# Patient Record
Sex: Female | Born: 1990 | Race: White | Hispanic: No | Marital: Single | Smoking: Current every day smoker
Health system: Southern US, Community
[De-identification: ages and names within clinical notes are randomized; demographics above are authoritative.]

---

## 2018-06-17 ENCOUNTER — Encounter (HOSPITAL_COMMUNITY): Payer: Self-pay | Admitting: *Deleted

## 2018-06-17 ENCOUNTER — Emergency Department (HOSPITAL_COMMUNITY): Payer: Self-pay

## 2018-06-17 ENCOUNTER — Emergency Department (HOSPITAL_COMMUNITY)
Admission: EM | Admit: 2018-06-17 | Discharge: 2018-06-17 | Disposition: A | Discharge status: Home / Self Care | Payer: Self-pay | Attending: Emergency Medicine | Admitting: Emergency Medicine

## 2018-06-17 DIAGNOSIS — F17210 Nicotine dependence, cigarettes, uncomplicated: Secondary | ICD-10-CM | POA: Insufficient documentation

## 2018-06-17 DIAGNOSIS — R1031 Right lower quadrant pain: Secondary | ICD-10-CM | POA: Insufficient documentation

## 2018-06-17 LAB — COMPREHENSIVE METABOLIC PANEL
ALBUMIN: 3.9 g/dL (ref 3.5–5.0)
ALT: 41 U/L (ref 0–44)
AST: 19 U/L (ref 15–41)
Alkaline Phosphatase: 52 U/L (ref 38–126)
Anion gap: 10 (ref 5–15)
BUN: 9 mg/dL (ref 6–20)
CHLORIDE: 103 mmol/L (ref 98–111)
CO2: 26 mmol/L (ref 22–32)
CREATININE: 0.73 mg/dL (ref 0.44–1.00)
Calcium: 9.3 mg/dL (ref 8.9–10.3)
GFR calc Af Amer: >60 mL/min (ref >60)
GLUCOSE: 87 mg/dL (ref 70–99)
Potassium: 3.9 mmol/L (ref 3.5–5.1)
Sodium: 139 mmol/L (ref 135–145)
TOTAL PROTEIN: 7.6 g/dL (ref 6.5–8.1)
Total Bilirubin: 0.2 mg/dL — ABNORMAL LOW (ref 0.3–1.2)

## 2018-06-17 LAB — CBC
HEMATOCRIT: 42.8 % (ref 36.0–46.0)
Hemoglobin: 14.7 g/dL (ref 12.0–15.0)
MCH: 29.8 pg (ref 26.0–34.0)
MCHC: 34.3 g/dL (ref 30.0–36.0)
MCV: 86.8 fL (ref 78.0–100.0)
PLATELETS: 237 10*3/uL (ref 150–400)
RBC: 4.93 MIL/uL (ref 3.87–5.11)
RDW: 12 % (ref 11.5–15.5)
WBC: 9.9 10*3/uL (ref 4.0–10.5)

## 2018-06-17 LAB — URINALYSIS, ROUTINE W REFLEX MICROSCOPIC
BACTERIA UA: NONE SEEN
BILIRUBIN URINE: NEGATIVE
Glucose, UA: NEGATIVE mg/dL
Hgb urine dipstick: NEGATIVE
Ketones, ur: NEGATIVE mg/dL
Nitrite: NEGATIVE
PH: 6 (ref 5.0–8.0)
Protein, ur: NEGATIVE mg/dL
SPECIFIC GRAVITY, URINE: 1.016 (ref 1.005–1.030)

## 2018-06-17 LAB — LIPASE, BLOOD: Lipase: 39 U/L (ref 11–51)

## 2018-06-17 LAB — I-STAT BETA HCG BLOOD, ED (MC, WL, AP ONLY)

## 2018-06-17 MED ORDER — IOPAMIDOL (ISOVUE-300) INJECTION 61%
INTRAVENOUS | Status: AC
  Filled 2018-06-17: qty 100

## 2018-06-17 MED ORDER — SODIUM CHLORIDE 0.9 % IV BOLUS: 1000.0000 mL | Freq: Once | INTRAVENOUS | Status: DC

## 2018-06-17 MED ORDER — MORPHINE SULFATE (PF) 4 MG/ML IV SOLN
4.0000 mg | Freq: Once | INTRAVENOUS | Status: DC
  Filled 2018-06-17: qty 1

## 2018-06-17 MED ORDER — ONDANSETRON HCL 4 MG/2ML IJ SOLN
4.0000 mg | Freq: Once | INTRAMUSCULAR | Status: DC
  Filled 2018-06-17: qty 2

## 2018-06-17 MED ORDER — IOPAMIDOL (ISOVUE-300) INJECTION 61%
100.0000 mL | Freq: Once | INTRAVENOUS | Status: AC | PRN
  Administered 2018-06-17: 100 mL via INTRAVENOUS

## 2018-06-17 NOTE — ED Provider Notes (Signed)
[facility]-EMERGENCY DEPT Provider Note   CSN: [id number] Arrival date & time: 06/17/18  1716     History   Chief Complaint Chief Complaint  Patient presents with  . Abdominal Pain    HPI Valerie Fowler is a 27 y.o. female with no significant past medical history who presents for evaluation of right lower quadrant abdominal pain that has been ongoing since yesterday.  Patient states that yesterday, she started developing some cramping middle abdominal pain.  Patient states that she thought she was getting ready to start her menstrual cycle.  Patient states that the pain started localizing to the right upper quadrant became progressively worsened over the last 24 hours.  Patient reports that she has had decreased appetite over the last several days.  No nausea/vomiting but does states that she has not felt like eating.  Patient also reports some constipation.  Her last bowel movement was today.  She states that there is no blood in the bowel movement.  Patient reports that she took Advil for her pain with no improvement.  Her last dose was 1 hour prior to ED arrival.  Patient states that she has not had any abdominal surgeries.  Patient reports chills yesterday but denies any fever.  Patient denies any chest pain, difficulty breathing, dysuria, hematuria, vaginal bleeding, vaginal discharge.  She is currently sexually active with one sexual partner.  They do not use any protection.  Patient is on birth control.  The history is provided by the patient.    History reviewed. No pertinent past medical history.  There are no active problems to display for this patient.   History reviewed. No pertinent surgical history.   OB History   None      Home Medications    Prior to Admission medications   Medication Sig Start Date End Date Taking? Authorizing Provider  acetaminophen (TYLENOL) 500 MG tablet Take 500 mg by mouth every 6 (six) hours as needed for moderate pain.    Yes [provider]  norgestimate-ethinyl estradiol (ORTHO-CYCLEN,SPRINTEC,PREVIFEM) 0.25-35 MG-MCG tablet Take 1 tablet by mouth daily.    Yes [provider]    Family History No family history on file.  Social History Social History   Tobacco Use  . Smoking status: Current Every Day Smoker  Substance Use Topics  . Alcohol use: Yes  . Drug use: Not on file     Allergies   Sulfa antibiotics   Review of Systems Review of Systems  Constitutional: Positive for appetite change and chills. Negative for fever.  Respiratory: Negative for cough and shortness of breath.   Cardiovascular: Negative for chest pain.  Gastrointestinal: Positive for abdominal pain. Negative for nausea and vomiting.  Genitourinary: Negative for dysuria, hematuria, vaginal bleeding and vaginal discharge.  All other systems reviewed and are negative.    Physical Exam Updated Vital Signs BP 102/78 (BP Location: Left Arm)   Pulse 85   Temp 98.7 F (37.1 C) (Oral)   Resp 20   LMP 06/11/2018   SpO2 98%   Physical Exam  Constitutional: She is oriented to person, place, and time. She appears well-developed and well-nourished.  Appears uncomfortable  HENT:  Head: Normocephalic and atraumatic.  Mouth/Throat: Oropharynx is clear and moist and mucous membranes are normal.  Eyes: Pupils are equal, round, and reactive to light. Conjunctivae, EOM and lids are normal.  Neck: Full passive range of motion without pain.  Cardiovascular: Normal rate, regular rhythm, normal heart sounds and normal  pulses. Exam reveals no gallop and no friction rub.  No murmur heard. Pulmonary/Chest: Effort normal and breath sounds normal.  Lungs clear to auscultation bilaterally.  Symmetric chest rise.  No wheezing, rales, rhonchi.  Abdominal: Soft. Normal appearance. There is tenderness in the right lower quadrant. There is CVA tenderness (Right) and tenderness at McBurney's point. There is no rigidity and no  guarding.  Abdomen is soft, nondistended.  Tenderness into the right lower quadrant, particularly at McBurney's point.  No rigidity, guarding.  Negative rebound, Rovsing's.  Right-sided CVA tenderness noted.  No peritoneal signs.  Musculoskeletal: Normal range of motion.  Neurological: She is alert and oriented to person, place, and time.  Skin: Skin is warm and dry. Capillary refill takes less than 2 seconds.  Psychiatric: She has a normal mood and affect. Her speech is normal.  Nursing note and vitals reviewed.    ED Treatments / Results  Labs (all labs ordered are listed, but only abnormal results are displayed) Labs Reviewed  COMPREHENSIVE METABOLIC PANEL - Abnormal; Notable for the following components:      Result Value   Total Bilirubin 0.2 (*)    All other components within normal limits  URINALYSIS, ROUTINE W REFLEX MICROSCOPIC - Abnormal; Notable for the following components:   Leukocytes, UA TRACE (*)    All other components within normal limits  LIPASE, BLOOD  CBC  I-STAT BETA HCG BLOOD, ED ([facility], [facility], [facility] ONLY)    EKG None  Radiology Ct Abdomen Pelvis W Contrast  Result Date: 06/17/2018 CLINICAL DATA:  Abdominal pain, primarily right-sided EXAM: CT ABDOMEN AND PELVIS WITH CONTRAST TECHNIQUE: Multidetector CT imaging of the abdomen and pelvis was performed using the standard protocol following bolus administration of intravenous contrast. CONTRAST:  <See Chart> ISOVUE-300 IOPAMIDOL (ISOVUE-300) INJECTION 61% COMPARISON:  None. FINDINGS: Lower chest: There is a calcified granuloma in the lateral segment of the left lower lobe. Lung bases otherwise are clear. Hepatobiliary: No focal liver lesions are appreciable. Gallbladder is contracted. No evident biliary duct dilatation. Pancreas: There is no pancreatic mass or inflammatory focus. Spleen: No splenic lesions are evident. Adrenals/Urinary Tract: Adrenals bilaterally appear normal. No evident renal mass or hydronephrosis on  either side. There is no renal or ureteral calculus on either side. Urinary bladder is midline with wall thickness borderline increased. Stomach/Bowel: No evident bowel wall or mesenteric thickening. No bowel obstruction. No free air or portal venous air. Vascular/Lymphatic: There is no abdominal aortic aneurysm. No vascular lesions are evident. There is no adenopathy in the abdomen or pelvis. Reproductive: Uterus is anteverted. There is no evident pelvic mass. Other: Appendix appears normal. There is no evident abscess or ascites in the abdomen or pelvis. Musculoskeletal: No blastic or lytic bone lesions. There is an apparent bone island in the right femoral neck. No intramuscular or abdominal wall lesion evident. IMPRESSION: 1. Borderline thickening of the urinary bladder wall. Question a degree of cystitis. Advise correlation with urinalysis in this regard. 2. No evident renal or ureteral calculus. No hydronephrosis on either side. 3. Appendix appears normal. No abscess in the abdomen or pelvis. No bowel obstruction. 4.  Gallbladder contracted, likely due to post-prandial state. 5.  Calcified granuloma left lung base. Electronically Signed   By: Bretta BangWilliam  Woodruff III M.D.   On: 06/17/2018 20:19    Procedures Procedures (including critical care time)  Medications Ordered in ED Medications  sodium chloride 0.9 % bolus 1,000 mL (0 mLs Intravenous Hold 06/17/18 1911)  ondansetron (ZOFRAN) injection 4  mg (0 mg Intravenous Hold 06/17/18 1911)  morphine 4 MG/ML injection 4 mg (0 mg Intravenous Hold 06/17/18 1911)  iopamidol (ISOVUE-300) 61 % injection 100 mL (100 mLs Intravenous Contrast Given 06/17/18 2004)     Initial Impression / Assessment and Plan / ED Course  I have reviewed the triage vital signs and the nursing notes.  Pertinent labs & imaging results that were available during my care of the patient were reviewed by me and considered in my medical decision making (see chart for details).      27 year old female who presents for evaluation of abdominal pain that began yesterday.  Associated with chills, decreased appetite.  No fever, nausea/vomiting. Patient is afebrile, non-toxic appearing, sitting comfortably on examination table. Vital signs reviewed and stable.  On exam, does exhibit some right lower quadrant abdominal tenderness, but again.  Additionally has right-sided CVA tenderness.  Consider pyelonephritis versus UTI versus appendicitis versus viral GI process vs ovarian etiology.  Initial labs ordered at triage.  Given distribution of pain and will proceed the CT abd/pelvis.  Antiemetics, analgesics and fluids initiated.  I-STAT beta negative.  Lipase unremarkable.  CBC without any significant leukocytosis, anemia.  CMP is unremarkable.  UA shows trace leukocytes.  RN informed me that patient was refusing IV, medications.  I discussed with patient regarding indication for IV and further work-up.  Patient is concerned about expenses.  I discussed with patient that I cannot guarantee that she does not have any infection or appendicitis based on her lab work alone.  I did discuss with her that given her description of tenderness, appendicitis was a consideration on my differential.  Patient is willing to get IV and CT scan but does not want any fluids, medications.  CT and pelvis shows no evidence of renal mass or hydronephrosis.  No evidence of kidney stone.  Appendix appears normal.  There is mention of borderline thickening of the urinary bladder that is question for cystitis.  Patient with no urinary complaints.  UA shows trace leukocytes otherwise unremarkable.  Discussed results with patient.  Patient still in pain.  Vital signs are stable.  She has not had any pain medications.  Again offered her pain medications that she declined here in ED.  My repeat evaluation, she was still tender in the right lower quadrant and suprapubic area.  I discussed with patient further work-up here  in the ED, including pelvic exam and ultrasound for further evaluation of ovarian etiology of patient's pain.  Patient declined any further work-up at this time.  I discussed with patient at length regarding risk first benefits of providing any further work-up, including but not limited to worsening condition, death.  I discussed with patient that given her still significant amount of pain, ultrasound would be indicated for evaluation of any ovarian etiology of symptoms.  Patient understands these risk first benefits and wishes to decline any further work-up.  Patient would like to leave.  Patient exhibits full medical decision-making capacity.  Encourage at home supportive care measures. Patient had ample opportunity for questions and discussion. All patient's questions were answered with full understanding. Strict return precautions discussed. Patient expresses understanding and agreement to plan.   Final Clinical Impressions(s) / ED Diagnoses   Final diagnoses:  Right lower quadrant abdominal pain    ED Discharge Orders    None       Rosana HoesLayden, Lindsey A, PA-C 06/17/18 2342    Gerhard MunchLockwood, Robert, MD 06/18/18 0005

## 2018-06-17 NOTE — ED Notes (Signed)
Patient currently refusing IV and medications. EDPA Mardella LaymanLindsey spoke with patient. Patient to speak with mother before making decision.

## 2018-06-17 NOTE — Discharge Instructions (Signed)
You can take Tylenol or Ibuprofen as directed for pain. You can alternate Tylenol and Ibuprofen every 4 hours. If you take Tylenol at 1pm, then you can take Ibuprofen at 5pm. Then you can take Tylenol again at 9pm.   As we discussed, please monitor your symptoms closely.  If you have any worsening pain, fever, vomiting, pain with urination or any other worsening or concerning symptoms, return to emergency department immediately.

## 2018-06-17 NOTE — ED Triage Notes (Signed)
Pt complains of right sided abdominal pain, chills since yesterday morning. Pt states she has rebound tenderness to RLQ. Pt took ibuprofen 1 hour prior to arrival to ED. Pt denies emesis or diarrhea. Pt states she briefly felt nauseas.

## 2018-06-19 ENCOUNTER — Emergency Department (HOSPITAL_COMMUNITY)
Admission: EM | Admit: 2018-06-19 | Discharge: 2018-06-19 | Disposition: A | Discharge status: Home / Self Care | Payer: Self-pay | Attending: Emergency Medicine | Admitting: Emergency Medicine

## 2018-06-19 ENCOUNTER — Encounter (HOSPITAL_COMMUNITY): Payer: Self-pay | Admitting: Emergency Medicine

## 2018-06-19 ENCOUNTER — Emergency Department (HOSPITAL_COMMUNITY): Payer: Self-pay

## 2018-06-19 DIAGNOSIS — R102 Pelvic and perineal pain: Secondary | ICD-10-CM | POA: Insufficient documentation

## 2018-06-19 DIAGNOSIS — N730 Acute parametritis and pelvic cellulitis: Secondary | ICD-10-CM | POA: Insufficient documentation

## 2018-06-19 DIAGNOSIS — F17210 Nicotine dependence, cigarettes, uncomplicated: Secondary | ICD-10-CM | POA: Insufficient documentation

## 2018-06-19 DIAGNOSIS — Z79899 Other long term (current) drug therapy: Secondary | ICD-10-CM | POA: Insufficient documentation

## 2018-06-19 LAB — WET PREP, GENITAL
Clue Cells Wet Prep HPF POC: NONE SEEN
Sperm: NONE SEEN
TRICH WET PREP: NONE SEEN
Yeast Wet Prep HPF POC: NONE SEEN

## 2018-06-19 LAB — URINALYSIS, ROUTINE W REFLEX MICROSCOPIC
BILIRUBIN URINE: NEGATIVE
Glucose, UA: NEGATIVE mg/dL
Ketones, ur: NEGATIVE mg/dL
Leukocytes, UA: NEGATIVE
Nitrite: NEGATIVE
Protein, ur: NEGATIVE mg/dL
SPECIFIC GRAVITY, URINE: 1.004 — AB (ref 1.005–1.030)
pH: 6 (ref 5.0–8.0)

## 2018-06-19 LAB — I-STAT BETA HCG BLOOD, ED (MC, WL, AP ONLY): I-stat hCG, quantitative: <5 m[IU]/mL (ref <5)

## 2018-06-19 LAB — GC/CHLAMYDIA PROBE AMP (~~LOC~~) NOT AT ARMC
Chlamydia: POSITIVE — AB
Neisseria Gonorrhea: NEGATIVE

## 2018-06-19 MED ORDER — CEFTRIAXONE SODIUM 250 MG IJ SOLR
250.0000 mg | Freq: Once | INTRAMUSCULAR | Status: AC
  Administered 2018-06-19: 250 mg via INTRAMUSCULAR
  Filled 2018-06-19: qty 250

## 2018-06-19 MED ORDER — LIDOCAINE HCL 2 % IJ SOLN
INTRAMUSCULAR | Status: AC
  Administered 2018-06-19: 400 mg
  Filled 2018-06-19: qty 20

## 2018-06-19 MED ORDER — KETOROLAC TROMETHAMINE 30 MG/ML IJ SOLN
30.0000 mg | Freq: Once | INTRAMUSCULAR | Status: AC
  Administered 2018-06-19: 30 mg via INTRAVENOUS
  Filled 2018-06-19: qty 1

## 2018-06-19 MED ORDER — MORPHINE SULFATE (PF) 4 MG/ML IV SOLN
4.0000 mg | Freq: Once | INTRAVENOUS | Status: AC
  Administered 2018-06-19: 4 mg via INTRAVENOUS
  Filled 2018-06-19: qty 1

## 2018-06-19 MED ORDER — DOXYCYCLINE HYCLATE 100 MG PO TABS
100.0000 mg | ORAL_TABLET | Freq: Once | ORAL | Status: AC
  Administered 2018-06-19: 100 mg via ORAL
  Filled 2018-06-19: qty 1

## 2018-06-19 MED ORDER — ONDANSETRON HCL 4 MG/2ML IJ SOLN
5.0000 mg | Freq: Once | INTRAMUSCULAR | Status: AC
  Administered 2018-06-19: 5 mg via INTRAVENOUS
  Filled 2018-06-19: qty 4

## 2018-06-19 MED ORDER — OXYCODONE-ACETAMINOPHEN 5-325 MG PO TABS: 1.0000 | ORAL_TABLET | ORAL | 0 refills | Status: AC | PRN

## 2018-06-19 MED ORDER — SODIUM CHLORIDE 0.9 % IV SOLN
INTRAVENOUS | Status: DC
  Administered 2018-06-19: 20 mL/h via INTRAVENOUS

## 2018-06-19 MED ORDER — DOXYCYCLINE HYCLATE 100 MG PO CAPS: 100.0000 mg | ORAL_CAPSULE | Freq: Two times a day (BID) | ORAL | 0 refills | Status: AC

## 2018-06-19 MED ORDER — LORAZEPAM 2 MG/ML IJ SOLN
0.5000 mg | Freq: Once | INTRAMUSCULAR | Status: AC
  Administered 2018-06-19: 0.5 mg via INTRAVENOUS
  Filled 2018-06-19: qty 1

## 2018-06-19 NOTE — ED Notes (Signed)
US at bedside

## 2018-06-19 NOTE — ED Provider Notes (Addendum)
[facility]-EMERGENCY DEPT Provider Note   CSN: [id number] Arrival date & time: 06/19/18  0758     History   Chief Complaint Chief Complaint  Patient presents with  . Diarrhea  . Abdominal Pain    HPI Valerie Fowler is a 27 y.o. female.  27 year old female presents with worsening right lower quadrant abdominal pain times several days.  Seen here 2 days ago for similar symptoms and that visit was reviewed and patient had abdominal pelvic CT which did not show any evidence of appendicitis or hydronephrosis.  States her pain is continued and characterizes it as sharp and goes between the right and left side.  Notes of vaginal spotting at this time.  No fever but some chills.  No dysuria or hematuria.  Pain waxes and wanes.  Has been self-medicating without relief.  No prior history of same.  Last menstrual period was 6 days ago.     History reviewed. No pertinent past medical history.  There are no active problems to display for this patient.   History reviewed. No pertinent surgical history.   OB History   None      Home Medications    Prior to Admission medications   Medication Sig Start Date End Date Taking? Authorizing Provider  acetaminophen (TYLENOL) 500 MG tablet Take 500 mg by mouth every 6 (six) hours as needed for moderate pain.   Yes [provider]  norgestimate-ethinyl estradiol (ORTHO-CYCLEN,SPRINTEC,PREVIFEM) 0.25-35 MG-MCG tablet Take 1 tablet by mouth daily.    Yes [provider]    Family History No family history on file.  Social History Social History   Tobacco Use  . Smoking status: Current Every Day Smoker    Types: Cigarettes  . Smokeless tobacco: Never Used  Substance Use Topics  . Alcohol use: Yes  . Drug use: Not on file     Allergies   Sulfa antibiotics   Review of Systems Review of Systems  All other systems reviewed and are negative.    Physical Exam Updated Vital Signs BP (!) 143/87  (BP Location: Left Arm)   Pulse 69   Temp 98.2 F (36.8 C) (Oral)   Resp 18   LMP 06/11/2018   SpO2 98%   Physical Exam  Constitutional: She is oriented to person, place, and time. She appears well-developed and well-nourished.  Non-toxic appearance. No distress.  HENT:  Head: Normocephalic and atraumatic.  Eyes: Pupils are equal, round, and reactive to light. Conjunctivae, EOM and lids are normal.  Neck: Normal range of motion. Neck supple. No tracheal deviation present. No thyroid mass present.  Cardiovascular: Normal rate, regular rhythm and normal heart sounds. Exam reveals no gallop.  No murmur heard. Pulmonary/Chest: Effort normal and breath sounds normal. No stridor. No respiratory distress. She has no decreased breath sounds. She has no wheezes. She has no rhonchi. She has no rales.  Abdominal: Soft. Normal appearance and bowel sounds are normal. She exhibits no distension. There is tenderness in the right lower quadrant. There is guarding. There is no rigidity, no rebound and no CVA tenderness.    Genitourinary: There is no rash on the right labia. There is no rash on the left labia. Cervix exhibits motion tenderness and discharge.  Musculoskeletal: Normal range of motion. She exhibits no edema or tenderness.  Neurological: She is alert and oriented to person, place, and time. She has normal strength. No cranial nerve deficit or sensory deficit. GCS eye subscore is 4. GCS verbal subscore  is 5. GCS motor subscore is 6.  Skin: Skin is warm and dry. No abrasion and no rash noted.  Psychiatric: She has a normal mood and affect. Her speech is normal and behavior is normal.  Nursing note and vitals reviewed.    ED Treatments / Results  Labs (all labs ordered are listed, but only abnormal results are displayed) Labs Reviewed  URINALYSIS, ROUTINE W REFLEX MICROSCOPIC  I-STAT BETA HCG BLOOD, ED ([facility], [facility], [facility] ONLY)    EKG None  Radiology Ct Abdomen Pelvis W Contrast  Result  Date: 06/17/2018 CLINICAL DATA:  Abdominal pain, primarily right-sided EXAM: CT ABDOMEN AND PELVIS WITH CONTRAST TECHNIQUE: Multidetector CT imaging of the abdomen and pelvis was performed using the standard protocol following bolus administration of intravenous contrast. CONTRAST:  <See Chart> ISOVUE-300 IOPAMIDOL (ISOVUE-300) INJECTION 61% COMPARISON:  None. FINDINGS: Lower chest: There is a calcified granuloma in the lateral segment of the left lower lobe. Lung bases otherwise are clear. Hepatobiliary: No focal liver lesions are appreciable. Gallbladder is contracted. No evident biliary duct dilatation. Pancreas: There is no pancreatic mass or inflammatory focus. Spleen: No splenic lesions are evident. Adrenals/Urinary Tract: Adrenals bilaterally appear normal. No evident renal mass or hydronephrosis on either side. There is no renal or ureteral calculus on either side. Urinary bladder is midline with wall thickness borderline increased. Stomach/Bowel: No evident bowel wall or mesenteric thickening. No bowel obstruction. No free air or portal venous air. Vascular/Lymphatic: There is no abdominal aortic aneurysm. No vascular lesions are evident. There is no adenopathy in the abdomen or pelvis. Reproductive: Uterus is anteverted. There is no evident pelvic mass. Other: Appendix appears normal. There is no evident abscess or ascites in the abdomen or pelvis. Musculoskeletal: No blastic or lytic bone lesions. There is an apparent bone island in the right femoral neck. No intramuscular or abdominal wall lesion evident. IMPRESSION: 1. Borderline thickening of the urinary bladder wall. Question a degree of cystitis. Advise correlation with urinalysis in this regard. 2. No evident renal or ureteral calculus. No hydronephrosis on either side. 3. Appendix appears normal. No abscess in the abdomen or pelvis. No bowel obstruction. 4.  Gallbladder contracted, likely due to post-prandial state. 5.  Calcified granuloma left  lung base. Electronically Signed   By: Bretta BangWilliam  Woodruff III M.D.   On: 06/17/2018 20:19    Procedures Procedures (including critical care time)  Medications Ordered in ED Medications  0.9 %  sodium chloride infusion (has no administration in time range)  morphine 4 MG/ML injection 4 mg (has no administration in time range)  ondansetron (ZOFRAN) injection 5 mg (has no administration in time range)     Initial Impression / Assessment and Plan / ED Course  I have reviewed the triage vital signs and the nursing notes.  Pertinent labs & imaging results that were available during my care of the patient were reviewed by me and considered in my medical decision making (see chart for details).    Patient treated for presumptive PID here as pelvic ultrasound is negative.  Does not have any peritoneal signs.  Do not think that this represents appendicitis as she has had a negative abdominal CT.  Have given analgesics here and return precautions  Final Clinical Impressions(s) / ED Diagnoses   Final diagnoses:  None    ED Discharge Orders    None       Lorre NickAllen, Morna Flud, MD 06/19/18 1030    Lorre NickAllen, Ahnya Akre, MD 06/19/18 1038

## 2018-06-19 NOTE — ED Triage Notes (Signed)
Pt was seen here Saturday for RLQ pain. Reports pains are still persistent as well as diarrhea since yesterday and now having bleeding but unsure if rectal or vaginal.

## 2019-04-06 IMAGING — CT CT ABD-PELV W/ CM
2 of 4 series · 16 of 46 positions shown, 18 images · IV contrast (ISOVUE)
Comparison: None.

CLINICAL DATA: Abdominal pain, primarily right-sided

EXAM:
CT ABDOMEN AND PELVIS WITH CONTRAST
TECHNIQUE: Multidetector CT imaging of the abdomen and pelvis was performed
using the standard protocol following bolus administration of
intravenous contrast.
CONTRAST:  <See Chart> V8CMGN-DDD IOPAMIDOL (V8CMGN-DDD) INJECTION
61%

[Series 2: axial st · axial · 0.88mm/px · z∈[+1002,+1412]mm · 13 of 94 slices shown, 15 images]
[im 6/94  soft-tissue]
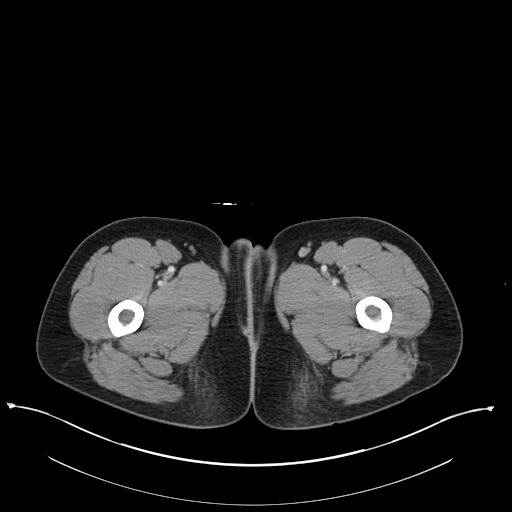
[im 6/94  bone]
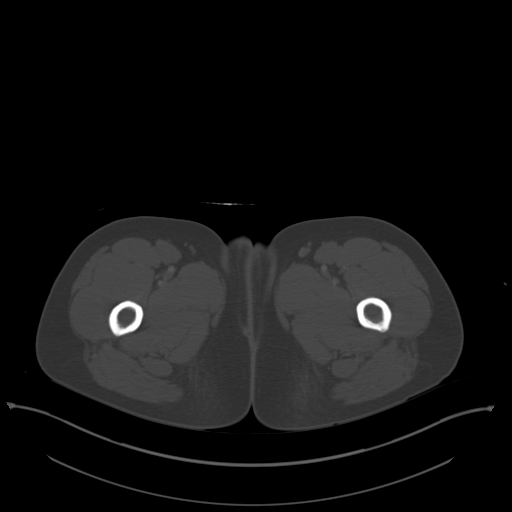
[im 11/94  soft-tissue]
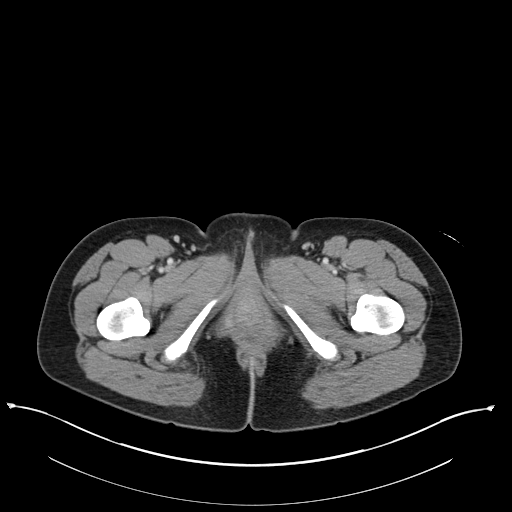
[im 22/94  soft-tissue]
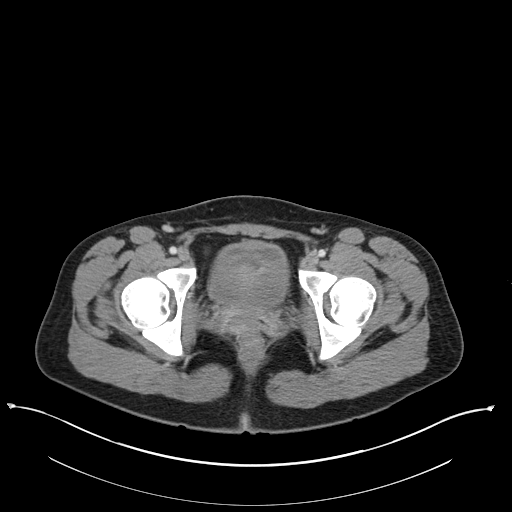
[im 28/94  soft-tissue]
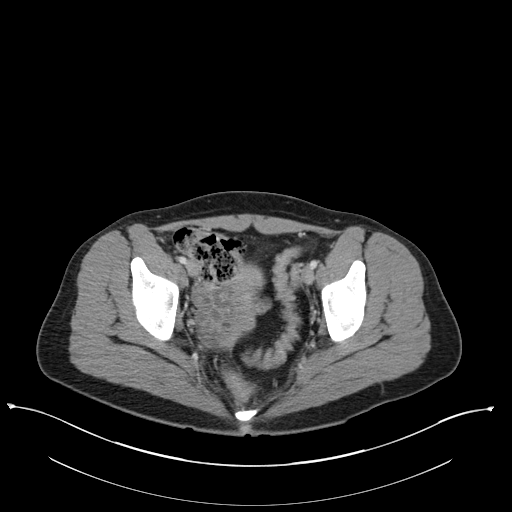
[im 33/94  soft-tissue]
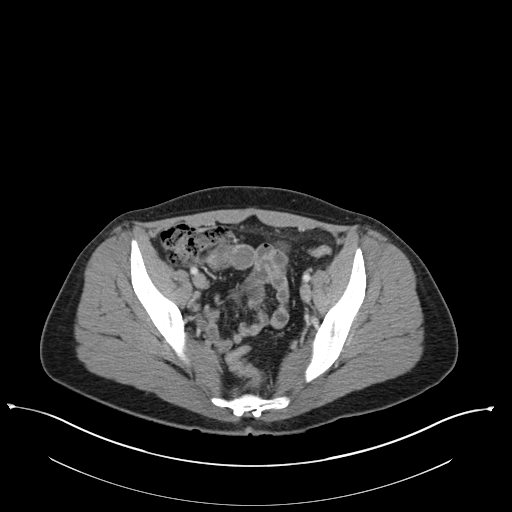
[im 39/94  soft-tissue]
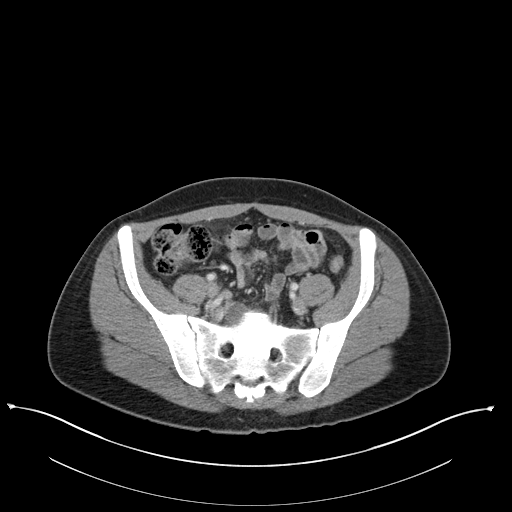
[im 50/94  soft-tissue]
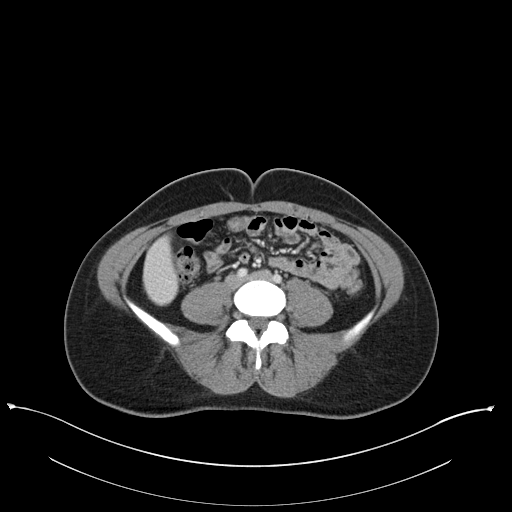
[im 55/94  soft-tissue]
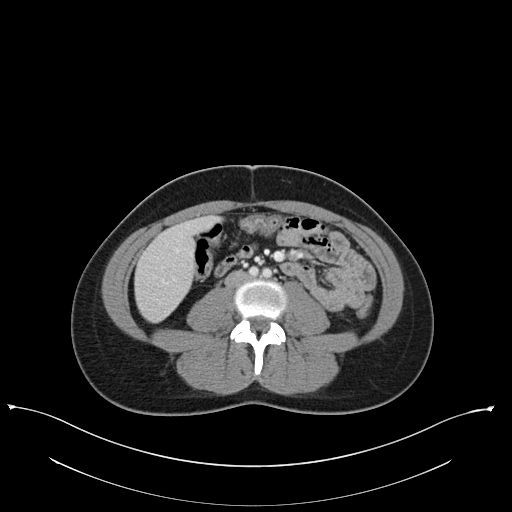
[im 61/94  soft-tissue]
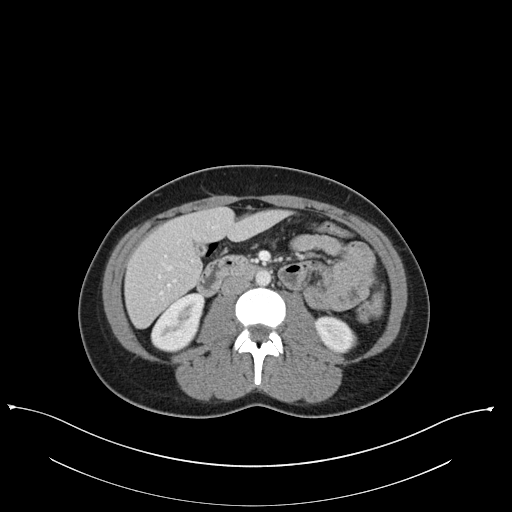
[im 61/94  bone]
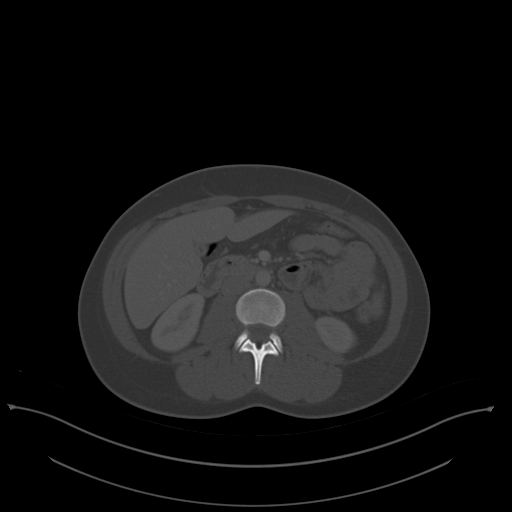
[im 66/94  soft-tissue]
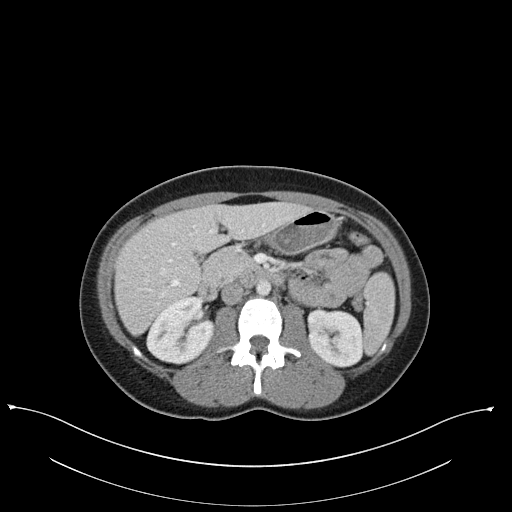
[im 72/94  soft-tissue]
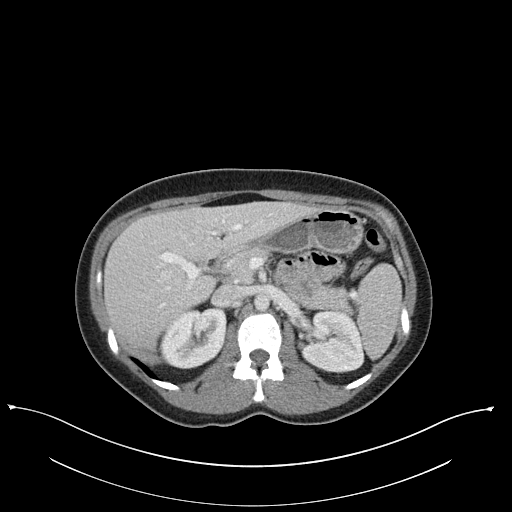
[im 83/94  soft-tissue]
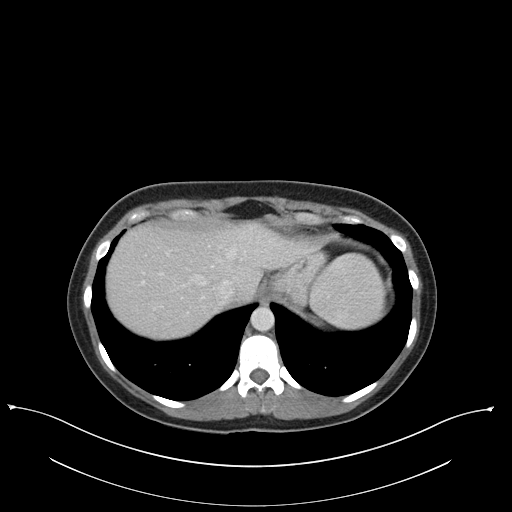
[im 88/94  soft-tissue]
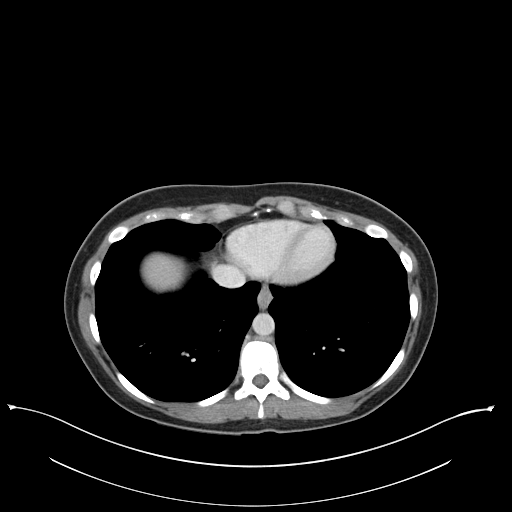

[Series 4: coronal st · coronal · 0.88mm/px · 3 of 99 slices shown]
[im 33/99  soft-tissue]
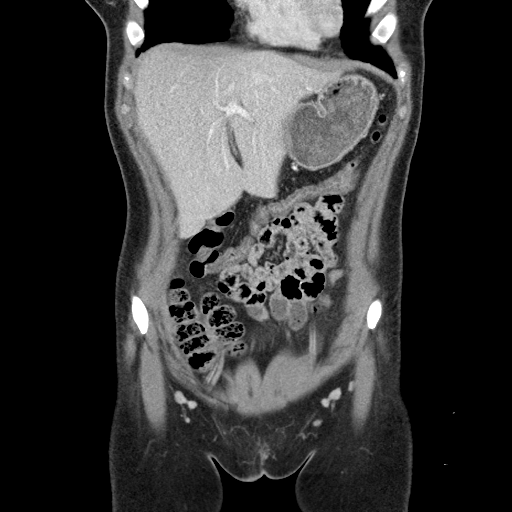
[im 44/99  soft-tissue]
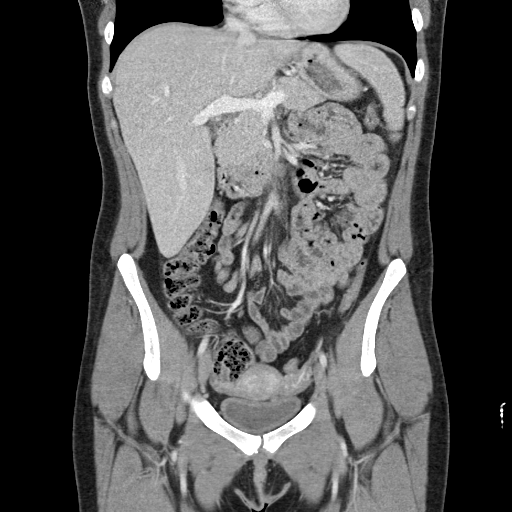
[im 55/99  soft-tissue]
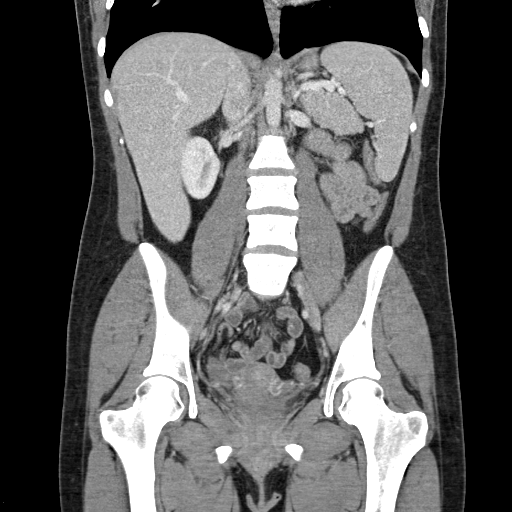

[16 of 46 positions shown; findings below may reference images not displayed]

FINDINGS: Lower chest: There is a calcified granuloma in the lateral segment
of the left lower lobe. Lung bases otherwise are clear.

Hepatobiliary: No focal liver lesions are appreciable. Gallbladder
is contracted. No evident biliary duct dilatation.

Pancreas: There is no pancreatic mass or inflammatory focus.

Spleen: No splenic lesions are evident.

Adrenals/Urinary Tract: Adrenals bilaterally appear normal. No
evident renal mass or hydronephrosis on either side. There is no
renal or ureteral calculus on either side. Urinary bladder is
midline with wall thickness borderline increased.

Stomach/Bowel: No evident bowel wall or mesenteric thickening. No
bowel obstruction. No free air or portal venous air.

Vascular/Lymphatic: There is no abdominal aortic aneurysm. No
vascular lesions are evident. There is no adenopathy in the abdomen
or pelvis.

Reproductive: Uterus is anteverted. There is no evident pelvic mass.

Other: Appendix appears normal. There is no evident abscess or
ascites in the abdomen or pelvis.

Musculoskeletal: No blastic or lytic bone lesions. There is an
apparent bone island in the right femoral neck. No intramuscular or
abdominal wall lesion evident.
IMPRESSION: 1. Borderline thickening of the urinary bladder wall. Question a
degree of cystitis. Advise correlation with urinalysis in this
regard.

2. No evident renal or ureteral calculus. No hydronephrosis on
either side.

3. Appendix appears normal. No abscess in the abdomen or pelvis. No
bowel obstruction.

4.  Gallbladder contracted, likely due to post-prandial state.

5.  Calcified granuloma left lung base.

## 2020-05-03 IMAGING — US US ART/VEN ABD/PELV/SCROTUM DOPPLER LTD
1 series · 13 of 25 positions shown · non-contrast
Comparison: CT pelvis 06/17/2018

CLINICAL DATA: Pelvic pain

EXAM:
TRANSABDOMINAL AND TRANSVAGINAL ULTRASOUND OF PELVIS
DOPPLER ULTRASOUND OF OVARIES
TECHNIQUE: Both transabdominal and transvaginal ultrasound examinations of the
pelvis were performed. Transabdominal technique was performed for
global imaging of the pelvis including uterus, ovaries, adnexal
regions, and pelvic cul-de-sac.
It was necessary to proceed with endovaginal exam following the
transabdominal exam to visualize the endometrium and ovaries. Color
and duplex Doppler ultrasound was utilized to evaluate blood flow to
the ovaries.

[Series 1: us art/ven abd/pelv/scrotum doppler ltd · 0.18mm/px · 13 of 115 slices shown]
[im 1/115]
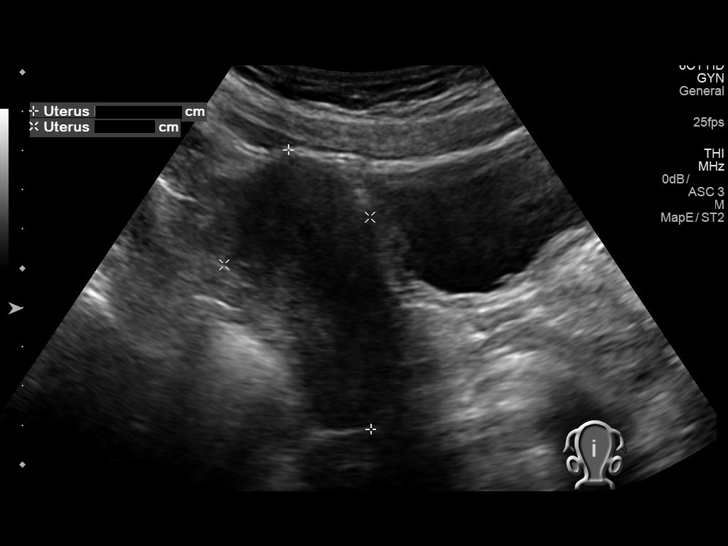
[im 10/115]
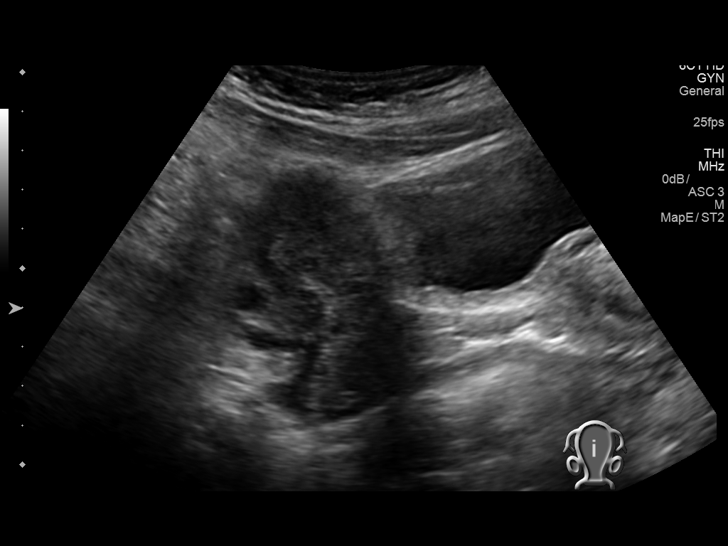
[im 20/115]
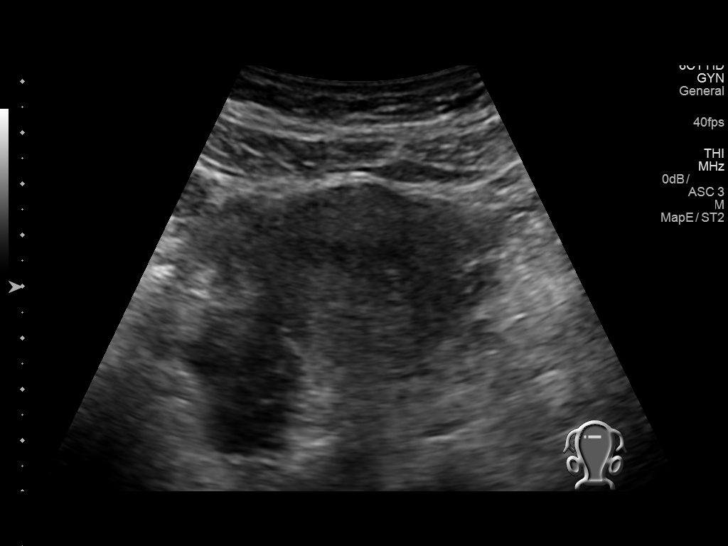
[im 29/115]
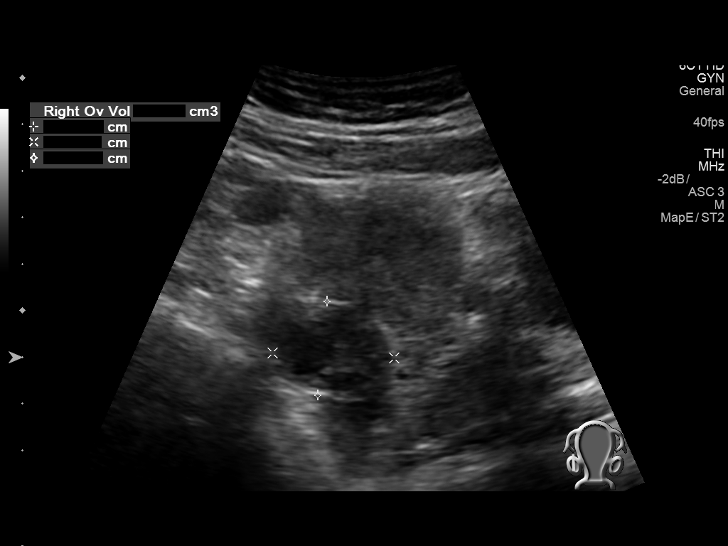
[im 39/115]
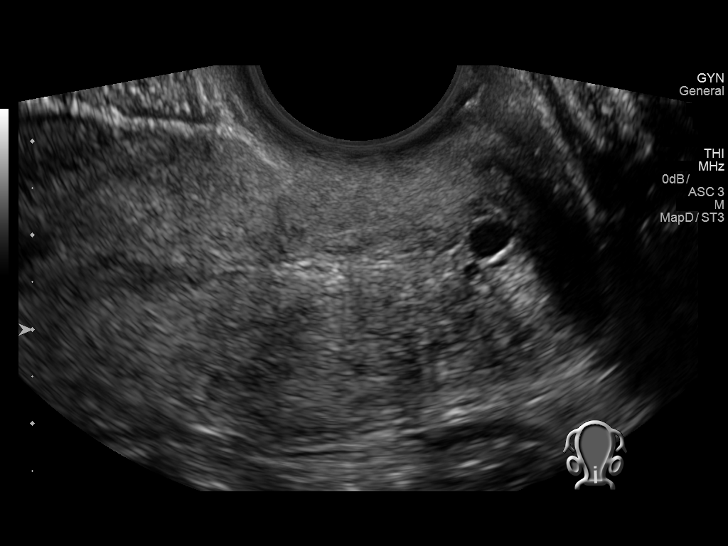
[im 48/115]
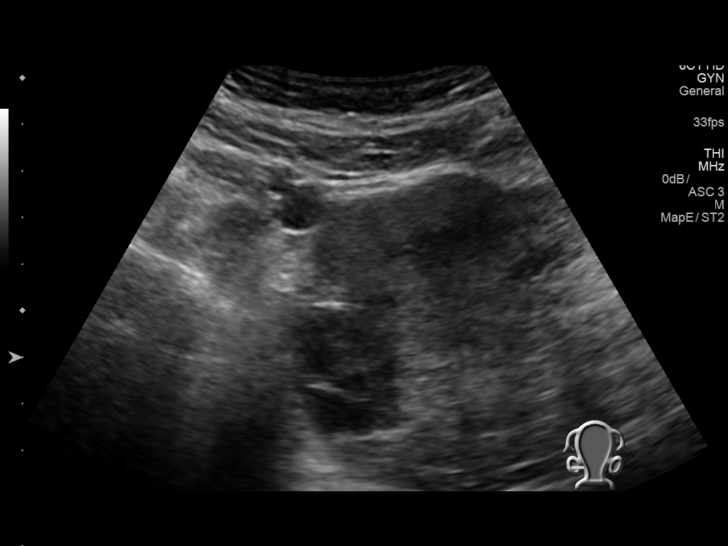
[im 58/115]
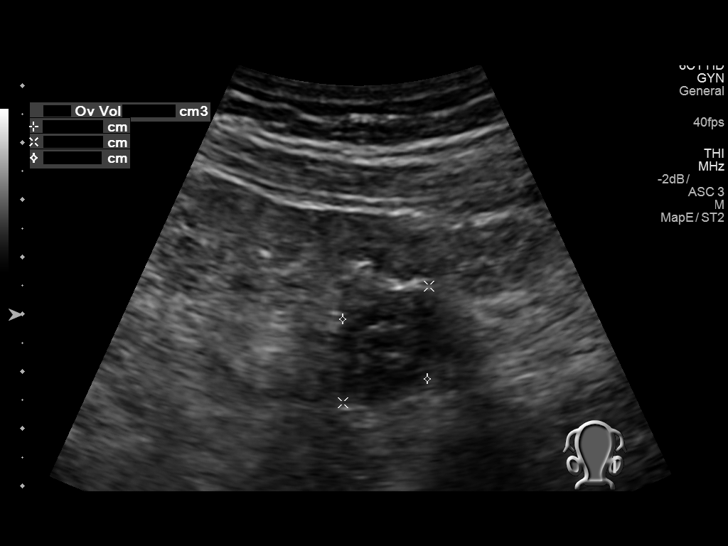
[im 67/115]
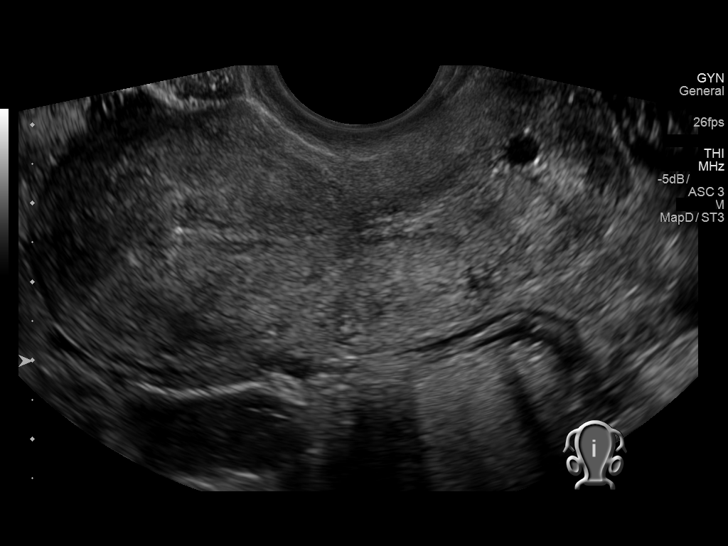
[im 77/115]
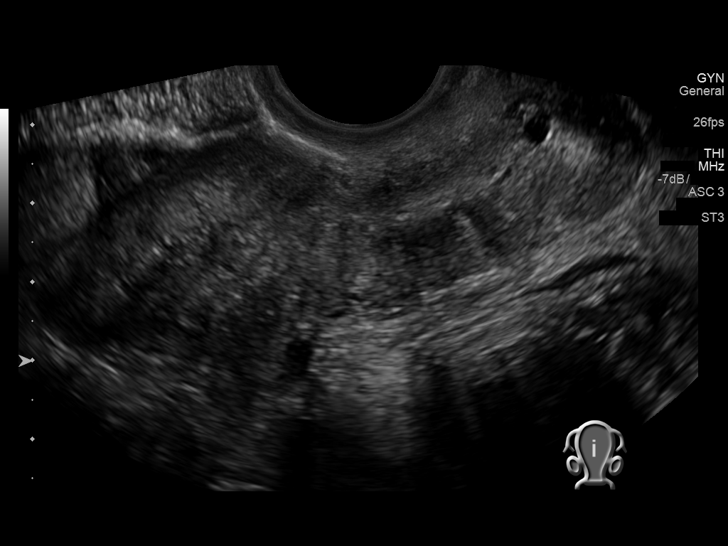
[im 86/115]
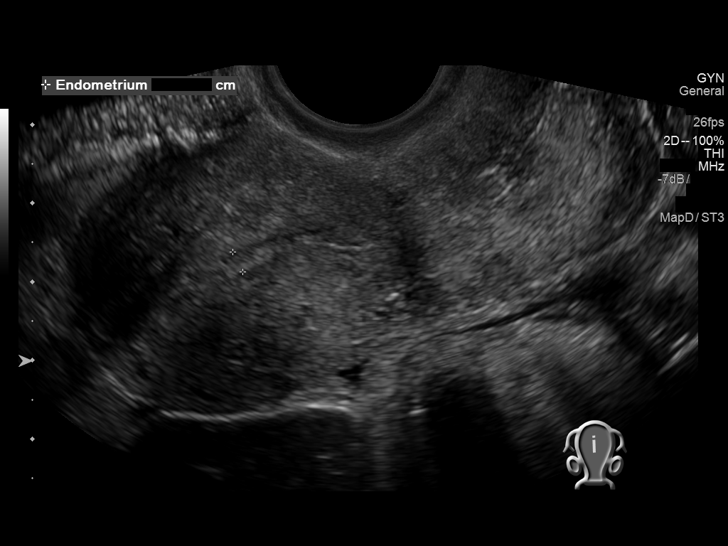
[im 96/115]
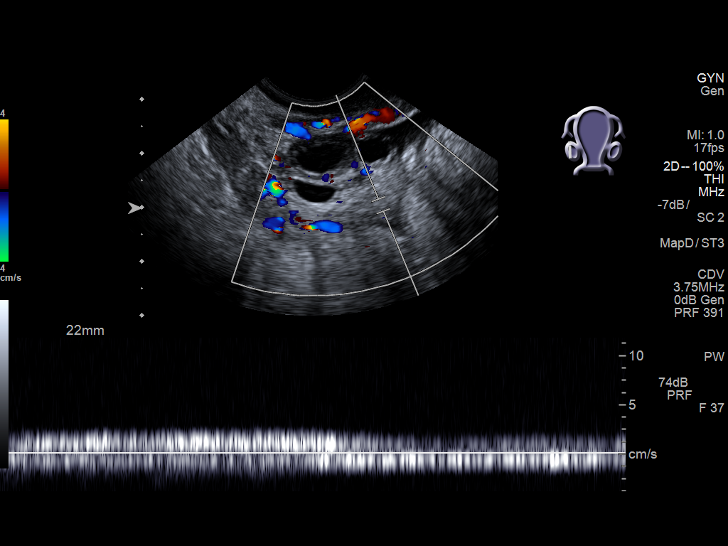
[im 105/115]
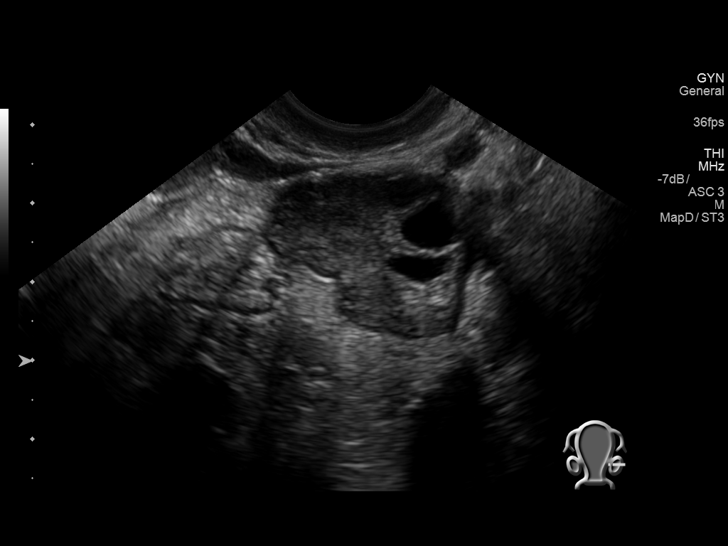
[im 115/115]
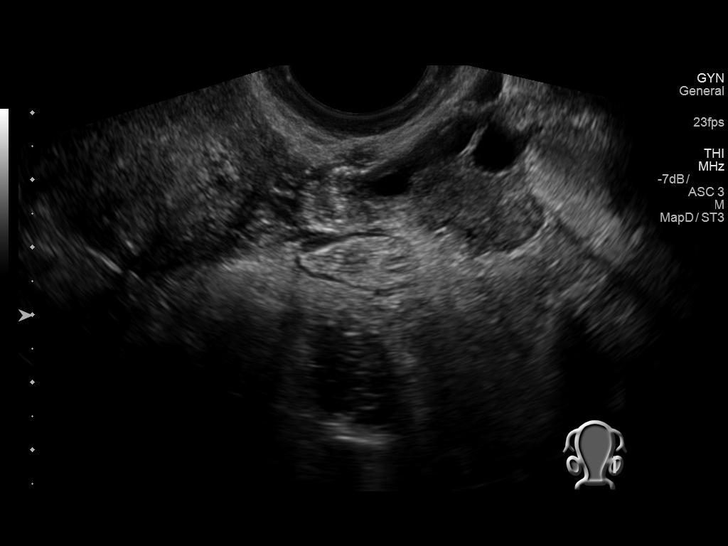

[13 of 25 positions shown; findings below may reference images not displayed]

FINDINGS: Uterus

Measurements: 7.8 by 3.6 by 4.0 cm. No fibroids or other mass
visualized. Incidental 5 mm nabothian cyst in the cervix.

Endometrium

Thickness: 3 mm.  No focal abnormality visualized.

Right ovary

Measurements: 3.4 by 1.9 by 3.2 cm. Normal appearance/no adnexal
mass. A dominant follicle measures 1.8 by 1.6 by 1.3 cm.

Left ovary

Measurements: 2.8 by 2.2 by 2.6 cm. Normal appearance/no adnexal
mass.

Pulsed Doppler evaluation of both ovaries demonstrates normal
low-resistance arterial and venous waveforms.

Other findings

Small amount of simple appearing free pelvic fluid along the
cul-de-sac.
IMPRESSION: 1. Overall normal sonographic appearance of the uterus and ovaries.
2. Small amount of simple appearing free pelvic fluid in the
cul-de-sac.
